# Patient Record
Sex: Female | Born: 1970 | Race: White | Hispanic: No | Smoking: Never smoker
Health system: Southern US, Community
[De-identification: ages and names within clinical notes are randomized; demographics above are authoritative.]

## PROBLEM LIST (undated history)

## (undated) DIAGNOSIS — F419 Anxiety disorder, unspecified: Secondary | ICD-10-CM

## (undated) DIAGNOSIS — E785 Hyperlipidemia, unspecified: Secondary | ICD-10-CM

## (undated) DIAGNOSIS — G4700 Insomnia, unspecified: Secondary | ICD-10-CM

## (undated) DIAGNOSIS — G2581 Restless legs syndrome: Secondary | ICD-10-CM

## (undated) DIAGNOSIS — E119 Type 2 diabetes mellitus without complications: Secondary | ICD-10-CM

## (undated) DIAGNOSIS — K219 Gastro-esophageal reflux disease without esophagitis: Secondary | ICD-10-CM

## (undated) DIAGNOSIS — I10 Essential (primary) hypertension: Secondary | ICD-10-CM

## (undated) DIAGNOSIS — F339 Major depressive disorder, recurrent, unspecified: Secondary | ICD-10-CM

## (undated) DIAGNOSIS — E669 Obesity, unspecified: Secondary | ICD-10-CM

## (undated) DIAGNOSIS — D509 Iron deficiency anemia, unspecified: Secondary | ICD-10-CM

## (undated) HISTORY — DX: Obesity, unspecified: E66.9

## (undated) HISTORY — PX: BREAST EXCISIONAL BIOPSY: SUR124

## (undated) HISTORY — DX: Essential (primary) hypertension: I10

## (undated) HISTORY — DX: Gastro-esophageal reflux disease without esophagitis: K21.9

## (undated) HISTORY — DX: Type 2 diabetes mellitus without complications: E11.9

## (undated) HISTORY — PX: TONSILLECTOMY: SUR1361

## (undated) HISTORY — DX: Insomnia, unspecified: G47.00

## (undated) HISTORY — PX: CERVICAL ABLATION: SHX5771

## (undated) HISTORY — DX: Iron deficiency anemia, unspecified: D50.9

## (undated) HISTORY — DX: Hyperlipidemia, unspecified: E78.5

## (undated) HISTORY — DX: Major depressive disorder, recurrent, unspecified: F33.9

## (undated) HISTORY — DX: Restless legs syndrome: G25.81

## (undated) HISTORY — DX: Anxiety disorder, unspecified: F41.9

---

## 2008-01-26 ENCOUNTER — Encounter: Admission: RE | Admit: 2008-01-26 | Discharge: 2008-01-26 | Payer: Self-pay | Admitting: Unknown Physician Specialty

## 2008-08-09 ENCOUNTER — Encounter: Admission: RE | Admit: 2008-08-09 | Discharge: 2008-08-09 | Payer: Self-pay | Admitting: Unknown Physician Specialty

## 2009-01-20 ENCOUNTER — Encounter: Admission: RE | Admit: 2009-01-20 | Discharge: 2009-01-20 | Payer: Self-pay | Admitting: Unknown Physician Specialty

## 2010-02-03 ENCOUNTER — Encounter: Admission: RE | Admit: 2010-02-03 | Discharge: 2010-02-03 | Payer: Self-pay | Admitting: Unknown Physician Specialty

## 2011-04-04 ENCOUNTER — Other Ambulatory Visit: Payer: Self-pay | Admitting: Unknown Physician Specialty

## 2011-04-04 DIAGNOSIS — Z1231 Encounter for screening mammogram for malignant neoplasm of breast: Secondary | ICD-10-CM

## 2011-04-09 ENCOUNTER — Ambulatory Visit: Payer: Self-pay

## 2011-04-19 ENCOUNTER — Ambulatory Visit
Admission: RE | Admit: 2011-04-19 | Discharge: 2011-04-19 | Disposition: A | Discharge status: Home / Self Care | Payer: BC Managed Care – PPO | Source: Ambulatory Visit | Attending: Unknown Physician Specialty | Admitting: Unknown Physician Specialty

## 2011-04-19 DIAGNOSIS — Z1231 Encounter for screening mammogram for malignant neoplasm of breast: Secondary | ICD-10-CM

## 2011-04-20 ENCOUNTER — Ambulatory Visit: Payer: Self-pay

## 2012-04-07 ENCOUNTER — Other Ambulatory Visit: Payer: Self-pay | Admitting: Unknown Physician Specialty

## 2012-04-07 DIAGNOSIS — Z1231 Encounter for screening mammogram for malignant neoplasm of breast: Secondary | ICD-10-CM

## 2012-05-09 ENCOUNTER — Ambulatory Visit
Admission: RE | Admit: 2012-05-09 | Discharge: 2012-05-09 | Disposition: A | Discharge status: Home / Self Care | Payer: BC Managed Care – PPO | Source: Ambulatory Visit | Attending: Unknown Physician Specialty | Admitting: Unknown Physician Specialty

## 2012-05-09 DIAGNOSIS — Z1231 Encounter for screening mammogram for malignant neoplasm of breast: Secondary | ICD-10-CM

## 2013-06-22 ENCOUNTER — Other Ambulatory Visit: Payer: Self-pay

## 2013-12-21 ENCOUNTER — Other Ambulatory Visit: Payer: Self-pay

## 2013-12-21 DIAGNOSIS — Z1231 Encounter for screening mammogram for malignant neoplasm of breast: Secondary | ICD-10-CM

## 2013-12-22 ENCOUNTER — Ambulatory Visit
Admission: RE | Admit: 2013-12-22 | Discharge: 2013-12-22 | Disposition: A | Discharge status: Home / Self Care | Payer: BC Managed Care – PPO | Source: Ambulatory Visit

## 2013-12-22 DIAGNOSIS — Z1231 Encounter for screening mammogram for malignant neoplasm of breast: Secondary | ICD-10-CM

## 2013-12-24 ENCOUNTER — Other Ambulatory Visit: Payer: Self-pay | Admitting: Unknown Physician Specialty

## 2013-12-24 DIAGNOSIS — R928 Other abnormal and inconclusive findings on diagnostic imaging of breast: Secondary | ICD-10-CM

## 2013-12-29 ENCOUNTER — Ambulatory Visit
Admission: RE | Admit: 2013-12-29 | Discharge: 2013-12-29 | Disposition: A | Discharge status: Home / Self Care | Payer: BC Managed Care – PPO | Source: Ambulatory Visit | Attending: Unknown Physician Specialty | Admitting: Unknown Physician Specialty

## 2013-12-29 DIAGNOSIS — R928 Other abnormal and inconclusive findings on diagnostic imaging of breast: Secondary | ICD-10-CM

## 2014-01-06 ENCOUNTER — Other Ambulatory Visit: Payer: BC Managed Care – PPO

## 2014-06-23 ENCOUNTER — Other Ambulatory Visit: Payer: Self-pay | Admitting: Unknown Physician Specialty

## 2014-06-23 DIAGNOSIS — N6320 Unspecified lump in the left breast, unspecified quadrant: Secondary | ICD-10-CM

## 2014-07-06 ENCOUNTER — Other Ambulatory Visit: Payer: BC Managed Care – PPO

## 2014-07-07 ENCOUNTER — Ambulatory Visit
Admission: RE | Admit: 2014-07-07 | Discharge: 2014-07-07 | Disposition: A | Discharge status: Home / Self Care | Payer: BC Managed Care – PPO | Source: Ambulatory Visit | Attending: Unknown Physician Specialty | Admitting: Unknown Physician Specialty

## 2014-07-07 DIAGNOSIS — N6320 Unspecified lump in the left breast, unspecified quadrant: Secondary | ICD-10-CM

## 2015-01-25 ENCOUNTER — Other Ambulatory Visit: Payer: Self-pay | Admitting: Unknown Physician Specialty

## 2015-01-25 DIAGNOSIS — N6320 Unspecified lump in the left breast, unspecified quadrant: Secondary | ICD-10-CM

## 2015-01-28 ENCOUNTER — Ambulatory Visit
Admission: RE | Admit: 2015-01-28 | Discharge: 2015-01-28 | Disposition: A | Discharge status: Home / Self Care | Payer: BLUE CROSS/BLUE SHIELD | Source: Ambulatory Visit | Attending: Unknown Physician Specialty | Admitting: Unknown Physician Specialty

## 2015-01-28 DIAGNOSIS — N6320 Unspecified lump in the left breast, unspecified quadrant: Secondary | ICD-10-CM

## 2016-04-05 ENCOUNTER — Other Ambulatory Visit: Payer: Self-pay | Admitting: Unknown Physician Specialty

## 2016-04-05 DIAGNOSIS — N6489 Other specified disorders of breast: Secondary | ICD-10-CM

## 2016-04-20 ENCOUNTER — Ambulatory Visit
Admission: RE | Admit: 2016-04-20 | Discharge: 2016-04-20 | Disposition: A | Discharge status: Home / Self Care | Payer: BLUE CROSS/BLUE SHIELD | Source: Ambulatory Visit | Attending: Unknown Physician Specialty | Admitting: Unknown Physician Specialty

## 2016-04-20 ENCOUNTER — Other Ambulatory Visit: Payer: Self-pay | Admitting: Unknown Physician Specialty

## 2016-04-20 DIAGNOSIS — N6489 Other specified disorders of breast: Secondary | ICD-10-CM

## 2016-04-25 ENCOUNTER — Other Ambulatory Visit: Payer: Self-pay | Admitting: Unknown Physician Specialty

## 2016-04-25 ENCOUNTER — Ambulatory Visit
Admission: RE | Admit: 2016-04-25 | Discharge: 2016-04-25 | Disposition: A | Discharge status: Home / Self Care | Payer: BLUE CROSS/BLUE SHIELD | Source: Ambulatory Visit | Attending: Unknown Physician Specialty | Admitting: Unknown Physician Specialty

## 2016-04-25 DIAGNOSIS — N6489 Other specified disorders of breast: Secondary | ICD-10-CM

## 2017-05-02 ENCOUNTER — Other Ambulatory Visit: Payer: Self-pay | Admitting: Obstetrics and Gynecology

## 2017-05-02 DIAGNOSIS — Z1231 Encounter for screening mammogram for malignant neoplasm of breast: Secondary | ICD-10-CM

## 2017-05-20 ENCOUNTER — Ambulatory Visit: Payer: BLUE CROSS/BLUE SHIELD

## 2017-06-19 ENCOUNTER — Ambulatory Visit
Admission: RE | Admit: 2017-06-19 | Discharge: 2017-06-19 | Disposition: A | Discharge status: Home / Self Care | Payer: BLUE CROSS/BLUE SHIELD | Source: Ambulatory Visit | Attending: Obstetrics and Gynecology | Admitting: Obstetrics and Gynecology

## 2017-06-19 ENCOUNTER — Ambulatory Visit: Payer: BLUE CROSS/BLUE SHIELD

## 2017-06-19 DIAGNOSIS — Z1231 Encounter for screening mammogram for malignant neoplasm of breast: Secondary | ICD-10-CM

## 2017-11-22 ENCOUNTER — Encounter: Payer: Self-pay | Admitting: Internal Medicine

## 2017-11-26 ENCOUNTER — Encounter: Payer: Self-pay | Admitting: *Deleted

## 2017-12-03 ENCOUNTER — Encounter: Payer: Self-pay | Admitting: Internal Medicine

## 2017-12-03 ENCOUNTER — Ambulatory Visit: Payer: BLUE CROSS/BLUE SHIELD | Admitting: Internal Medicine

## 2017-12-03 VITALS — BP 128/76 | HR 80 | Ht 63.0 in | Wt 203.0 lb

## 2017-12-03 DIAGNOSIS — K219 Gastro-esophageal reflux disease without esophagitis: Secondary | ICD-10-CM

## 2017-12-03 DIAGNOSIS — D509 Iron deficiency anemia, unspecified: Secondary | ICD-10-CM | POA: Diagnosis not present

## 2017-12-03 DIAGNOSIS — R1310 Dysphagia, unspecified: Secondary | ICD-10-CM | POA: Diagnosis not present

## 2017-12-03 DIAGNOSIS — R1319 Other dysphagia: Secondary | ICD-10-CM

## 2017-12-03 MED ORDER — SUPREP BOWEL PREP KIT 17.5-3.13-1.6 GM/177ML PO SOLN: 1.0000 | ORAL | 0 refills | Status: DC

## 2017-12-03 NOTE — Patient Instructions (Addendum)
You have been scheduled for an endoscopy and colonoscopy. Please follow the written instructions given to you at your visit today. Please pick up your prep supplies at the pharmacy within the next 1-3 days. If you use inhalers (even only as needed), please bring them with you on the day of your procedure. Your physician has requested that you go to www.startemmi.com and enter the access code given to you at your visit today. This web site gives a general overview about your procedure. However, you should still follow specific instructions given to you by our office regarding your preparation for the procedure.  Please purchase the following medications over the counter and take as directed: Ferrous Sulfate-325 mg once daily--- Hold 1 week prior to procedure  If you are age 73 or older, your body mass index should be between 23-30. Your Body mass index is 35.96 kg/m. If this is out of the aforementioned range listed, please consider follow up with your Primary Care Provider.  If you are age 36 or younger, your body mass index should be between 19-25. Your Body mass index is 35.96 kg/m. If this is out of the aformentioned range listed, please consider follow up with your Primary Care Provider.

## 2017-12-04 NOTE — Progress Notes (Signed)
Patient ID: Marilyn Mccall, female   DOB: [birth year], 47 y.o.   MRN: [id number] HPI: Marilyn Mccall is a 47 year old female with a history of GERD, menorrhagia, depression, hypertension, hyperlipidemia who was seen in consultation at the request of Dr. Maurilio Lovely Pomposini to evaluate iron deficiency anemia.  She is here today with her mother.  She reports she recently established care with her new primary physician and was told she needed a GI consultation for IDA.    She denies any visible blood in her stool and melena.  She does not have any specific GI complaint.  She does have a history of heartburn and reflux and has been treated with omeprazole since the early 1990s.  Heartburn is well controlled on omeprazole 40 mg daily.  Occasionally when eating foods such as meat food will stop in her esophagus and she will feel a choking sensation.  She is able to usually force food down with liquids.  This is happening very intermittently and is not been progressive.  She does have at times loose stools which are stress triggered.  She has a family history of colon cancer with her maternal great grandfather, a maternal great aunt and a maternal uncle with colon polyps.  Greater than 10 years ago she had a upper endoscopy performed in [place] and recalls a history of gastritis.  She has been dealing with very heavy menstrual periods which have been irregular over the last 6 months.  Her periods have always been rather heavy but in the last 6 months they have become more so.  She is following with GYN for this issue.  Past Medical History:  Diagnosis Date  . Anxiety   . Benign essential hypertension   . Depression, major, recurrent (HCC)   . GERD (gastroesophageal reflux disease)   . Hyperlipidemia   . IDA (iron deficiency anemia)   . Insomnia   . Obesity   . Restless legs     Past Surgical History:  Procedure Laterality Date  . BREAST EXCISIONAL BIOPSY    . CESAREAN SECTION    . TONSILLECTOMY       Outpatient Medications Prior to Visit  Medication Sig Dispense Refill  . ferrous sulfate 325 (65 FE) MG tablet Take 1 tablet by mouth 2 (two) times daily.    Marland Kitchen FLUoxetine (PROZAC) 40 MG capsule Take 1 capsule by mouth daily.    Marland Kitchen lisinopril-hydrochlorothiazide (PRINZIDE,ZESTORETIC) 20-12.5 MG tablet Take by mouth daily.    Marland Kitchen omeprazole (PRILOSEC) 40 MG capsule Take 1 capsule by mouth daily.    . pramipexole (MIRAPEX) 0.25 MG tablet Take 1 tablet by mouth at bedtime as needed.    . traZODone (DESYREL) 50 MG tablet Take 1 tablet by mouth at bedtime as needed.     No facility-administered medications prior to visit.     Allergies  Allergen Reactions  . Cephalexin Other (See Comments)    Other Reaction: GI Upset  . Erythromycin Other (See Comments)    Other Reaction: GI Upset  . Simvastatin Other (See Comments)    myalgias    Family History  Problem Relation Age of Onset  . Hypertension Father   . Glaucoma Father   . Retinopathy of prematurity Father   . Arthritis Brother        psoriatic    Social History   Tobacco Use  . Smoking status: Never Smoker  . Smokeless tobacco: Never Used  Substance Use Topics  . Alcohol use: Yes    Comment: socially  .  Drug use: Never    ROS: As per history of present illness, otherwise negative  BP 128/76   Pulse 80   Ht  (1.6 m)   Wt 203 lb (92.1 kg)   LMP 11/22/2017 (Approximate)   BMI 35.96 kg/m  Constitutional: Well-developed and well-nourished. No distress. HEENT: Normocephalic and atraumatic. Oropharynx is clear and moist. Conjunctivae are normal.  No scleral icterus. Neck: Neck supple. Trachea midline. Cardiovascular: Normal rate, regular rhythm and intact distal pulses. No M/R/G Pulmonary/chest: Effort normal and breath sounds normal. No wheezing, rales or rhonchi. Abdominal: Soft, nontender, nondistended. Bowel sounds active throughout. There are no masses palpable. No hepatosplenomegaly. Extremities: no  clubbing, cyanosis, or edema Neurological: Alert and oriented to person place and time. Skin: Skin is warm and dry.  Psychiatric: Normal mood and affect. Behavior is normal.  RELEVANT LABS AND IMAGING: Labs from primary care from May 2019 Ferritin 5 Total iron 37 B12 364 Hemoglobin 10.1, MCV 73.2 Platelet count normal 227, white count 8.1 TSH 0.94 Vitamin D 34 Normal liver enzymes, AST 16, ALT 13, total bilirubin 0.4, alkaline phosphatase 71   ASSESSMENT/PLAN: 47 year old female with a history of GERD, menorrhagia, depression, hypertension, hyperlipidemia who was seen in consultation at the request of Dr. Maurilio Lovely Pomposini to evaluate iron deficiency anemia.  1. IDA --very likely her iron deficiency is due to heavy menstrual bleeding in the last 6 months.  We discussed how at times iron deficiency anemia can come from GI blood loss.  She has had no visible blood loss.  After our discussion she would prefer reassurance from knowing there is no GI pathology, which I think is very reasonable.  We will proceed with upper endoscopy and colonoscopy to further evaluate her iron deficiency anemia.  We discussed the risk, benefits and alternatives and she is agreeable to proceed.  Plan small bowel biopsies to exclude celiac disease. --She has not yet started oral iron but I recommend she start ferrous sulfate 325 mg at least once daily.  If hard for her to tolerate we can consider IV iron.  She should have hemoglobin and iron studies followed closely to ensure improvement.  2.  GERD with intermittent mild dysphagia --long-standing reflux disease well-controlled on omeprazole 40 mg daily.  Upper endoscopy being performed as per #1 but will consider dilation if esophageal stricture or ring is found.  Cc: Dr. Reuel Boom Pomposini

## 2017-12-18 ENCOUNTER — Telehealth: Payer: Self-pay | Admitting: Internal Medicine

## 2017-12-18 NOTE — Telephone Encounter (Signed)
A user error has taken place: ERROR °

## 2017-12-19 ENCOUNTER — Encounter: Payer: Self-pay | Admitting: Internal Medicine

## 2017-12-26 ENCOUNTER — Encounter: Payer: BLUE CROSS/BLUE SHIELD | Admitting: Internal Medicine

## 2018-05-15 ENCOUNTER — Other Ambulatory Visit: Payer: Self-pay | Admitting: Obstetrics and Gynecology

## 2018-05-15 DIAGNOSIS — Z1231 Encounter for screening mammogram for malignant neoplasm of breast: Secondary | ICD-10-CM

## 2018-06-25 ENCOUNTER — Ambulatory Visit: Payer: BLUE CROSS/BLUE SHIELD

## 2018-06-27 ENCOUNTER — Ambulatory Visit
Admission: RE | Admit: 2018-06-27 | Discharge: 2018-06-27 | Disposition: A | Discharge status: Home / Self Care | Payer: BLUE CROSS/BLUE SHIELD | Source: Ambulatory Visit | Attending: Obstetrics and Gynecology | Admitting: Obstetrics and Gynecology

## 2018-06-27 DIAGNOSIS — Z1231 Encounter for screening mammogram for malignant neoplasm of breast: Secondary | ICD-10-CM

## 2019-06-05 ENCOUNTER — Other Ambulatory Visit: Payer: Self-pay | Admitting: Obstetrics and Gynecology

## 2019-06-05 DIAGNOSIS — Z1231 Encounter for screening mammogram for malignant neoplasm of breast: Secondary | ICD-10-CM

## 2019-06-22 ENCOUNTER — Other Ambulatory Visit: Payer: Self-pay | Admitting: Obstetrics and Gynecology

## 2019-06-22 DIAGNOSIS — N946 Dysmenorrhea, unspecified: Secondary | ICD-10-CM

## 2019-06-30 ENCOUNTER — Ambulatory Visit
Admission: RE | Admit: 2019-06-30 | Discharge: 2019-06-30 | Disposition: A | Discharge status: Home / Self Care | Payer: BLUE CROSS/BLUE SHIELD | Source: Ambulatory Visit | Attending: Obstetrics and Gynecology | Admitting: Obstetrics and Gynecology

## 2019-06-30 DIAGNOSIS — N946 Dysmenorrhea, unspecified: Secondary | ICD-10-CM

## 2019-07-31 ENCOUNTER — Ambulatory Visit
Admission: RE | Admit: 2019-07-31 | Discharge: 2019-07-31 | Disposition: A | Discharge status: Home / Self Care | Payer: BC Managed Care – PPO | Source: Ambulatory Visit | Attending: Obstetrics and Gynecology | Admitting: Obstetrics and Gynecology

## 2019-07-31 ENCOUNTER — Other Ambulatory Visit: Payer: Self-pay

## 2019-07-31 DIAGNOSIS — Z1231 Encounter for screening mammogram for malignant neoplasm of breast: Secondary | ICD-10-CM

## 2020-08-15 ENCOUNTER — Other Ambulatory Visit: Payer: Self-pay | Admitting: Obstetrics and Gynecology

## 2020-08-15 DIAGNOSIS — Z1231 Encounter for screening mammogram for malignant neoplasm of breast: Secondary | ICD-10-CM

## 2020-09-30 ENCOUNTER — Ambulatory Visit
Admission: RE | Admit: 2020-09-30 | Discharge: 2020-09-30 | Disposition: A | Discharge status: Home / Self Care | Payer: BC Managed Care – PPO | Source: Ambulatory Visit | Attending: Obstetrics and Gynecology | Admitting: Obstetrics and Gynecology

## 2020-09-30 ENCOUNTER — Other Ambulatory Visit: Payer: Self-pay

## 2020-09-30 DIAGNOSIS — Z1231 Encounter for screening mammogram for malignant neoplasm of breast: Secondary | ICD-10-CM

## 2021-10-16 ENCOUNTER — Other Ambulatory Visit: Payer: Self-pay | Admitting: Obstetrics and Gynecology

## 2021-10-16 DIAGNOSIS — Z1231 Encounter for screening mammogram for malignant neoplasm of breast: Secondary | ICD-10-CM

## 2021-10-24 ENCOUNTER — Ambulatory Visit
Admission: RE | Admit: 2021-10-24 | Discharge: 2021-10-24 | Disposition: A | Discharge status: Home / Self Care | Payer: BC Managed Care – PPO | Source: Ambulatory Visit | Attending: Obstetrics and Gynecology | Admitting: Obstetrics and Gynecology

## 2021-10-24 DIAGNOSIS — Z1231 Encounter for screening mammogram for malignant neoplasm of breast: Secondary | ICD-10-CM

## 2022-08-31 ENCOUNTER — Encounter: Payer: Self-pay | Admitting: Internal Medicine

## 2022-10-11 ENCOUNTER — Encounter: Payer: Self-pay | Admitting: Internal Medicine

## 2022-10-11 ENCOUNTER — Other Ambulatory Visit: Payer: Self-pay | Admitting: Obstetrics and Gynecology

## 2022-10-11 DIAGNOSIS — Z1231 Encounter for screening mammogram for malignant neoplasm of breast: Secondary | ICD-10-CM

## 2022-11-07 IMAGING — MG MM DIGITAL SCREENING BILAT W/ TOMO AND CAD
6 of 10 series · 6 of 30 positions shown · non-contrast
Comparison: Previous exam(s).

CLINICAL DATA: Screening.

EXAM:
DIGITAL SCREENING BILATERAL MAMMOGRAM WITH TOMOSYNTHESIS AND CAD
TECHNIQUE: Bilateral screening digital craniocaudal and mediolateral oblique
mammograms were obtained. Bilateral screening digital breast
tomosynthesis was performed. The images were evaluated with
computer-aided detection.

[R CC synth-2D]
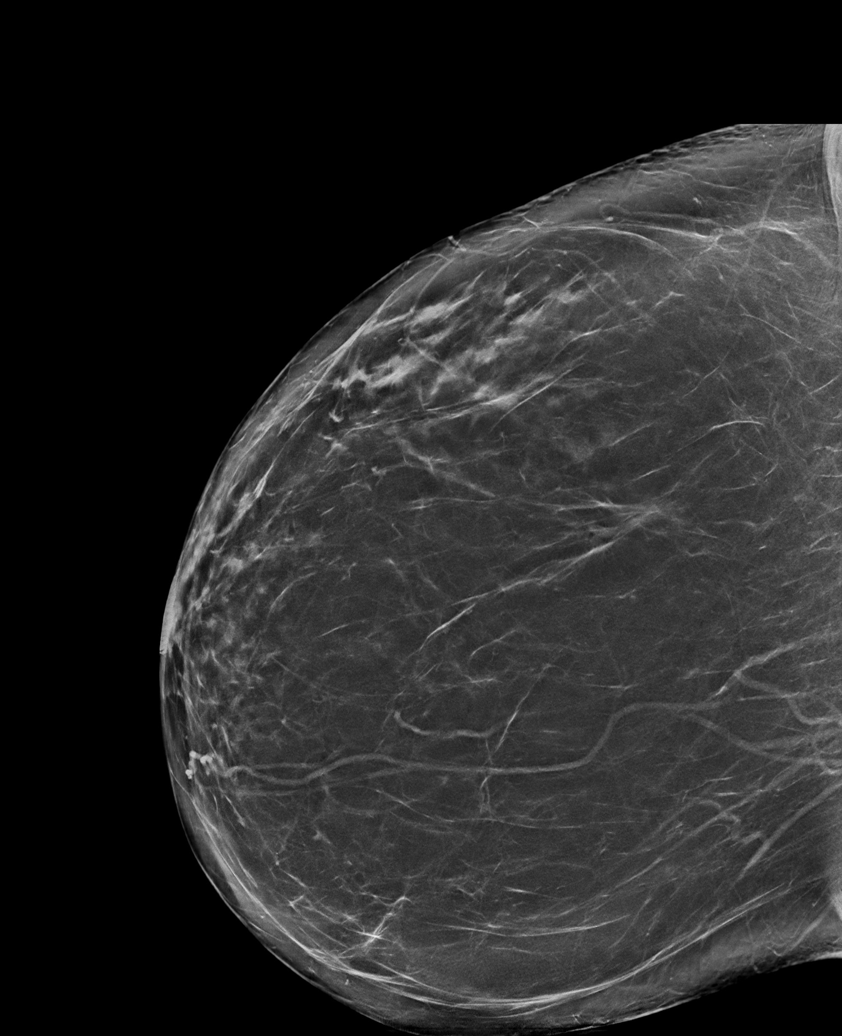

[R CV synth-2D]
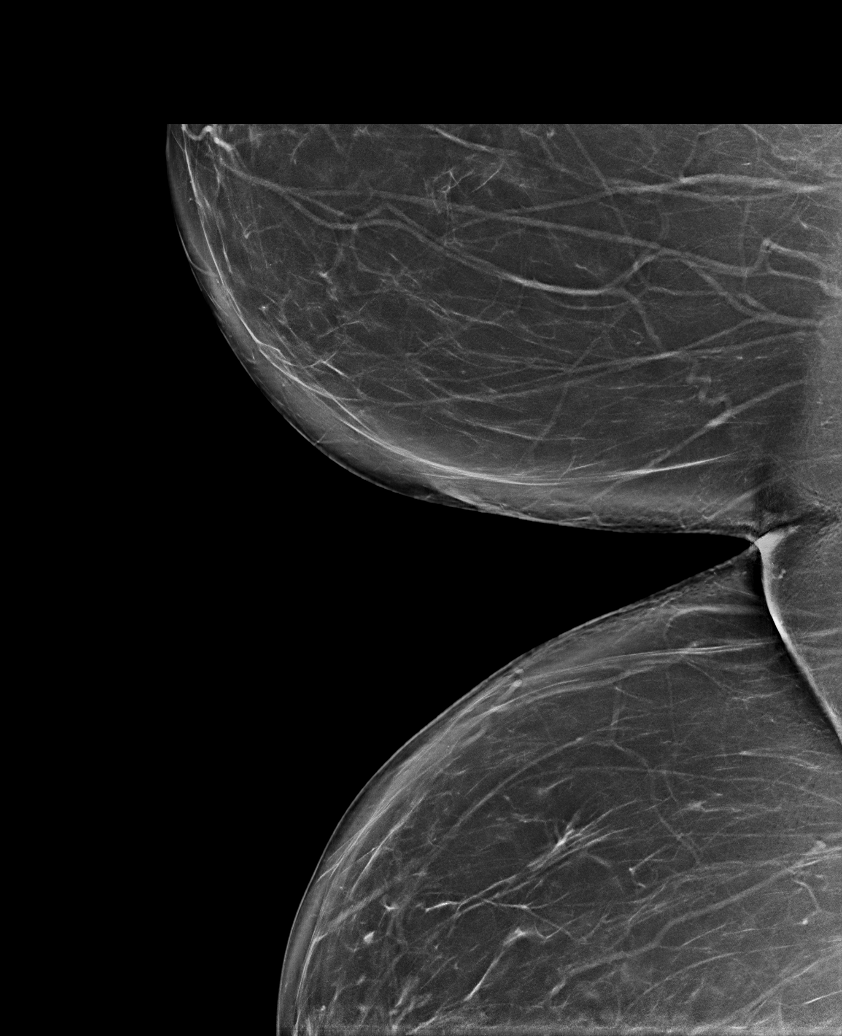

[L CC synth-2D]
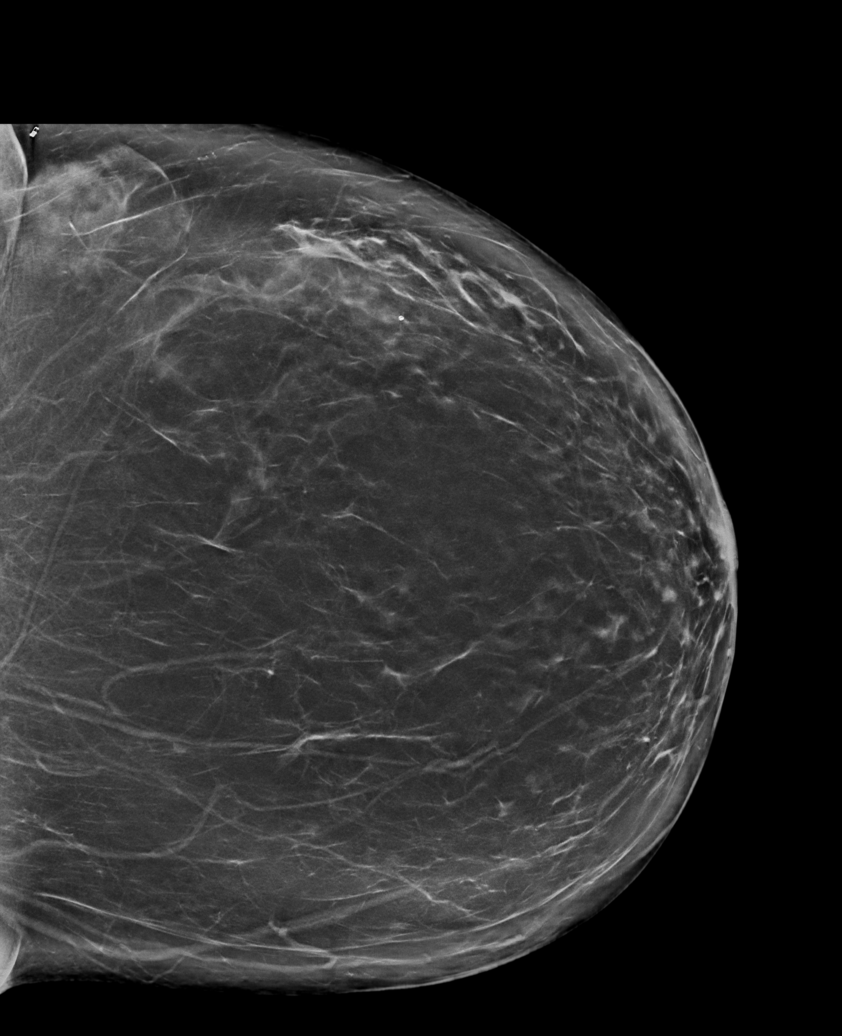

[R MLO synth-2D]
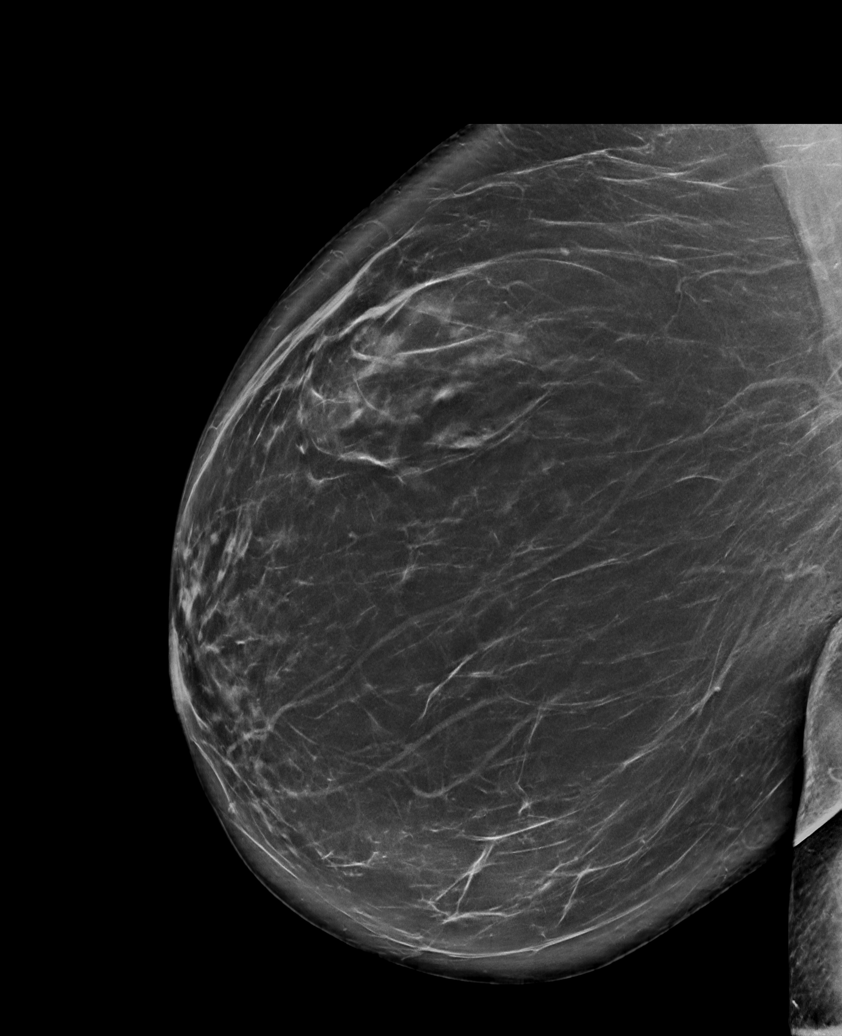

[L MLO synth-2D]
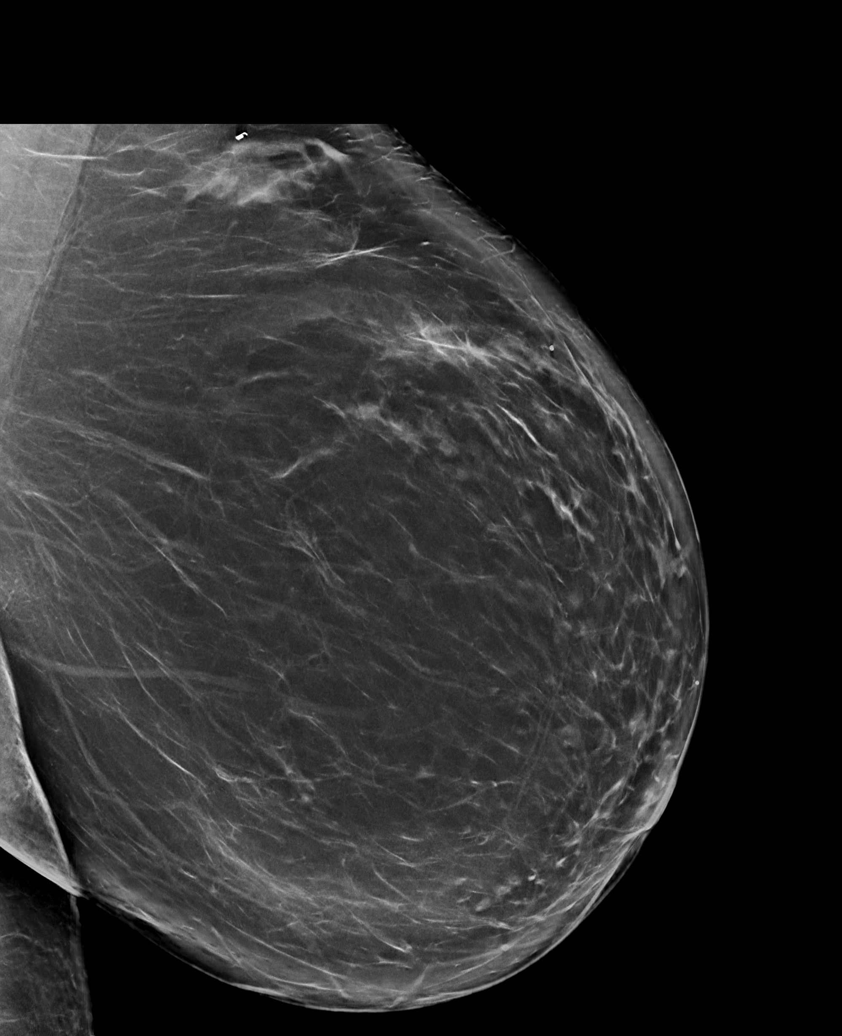

[R CV tomo · tomo slice 47/94.0]
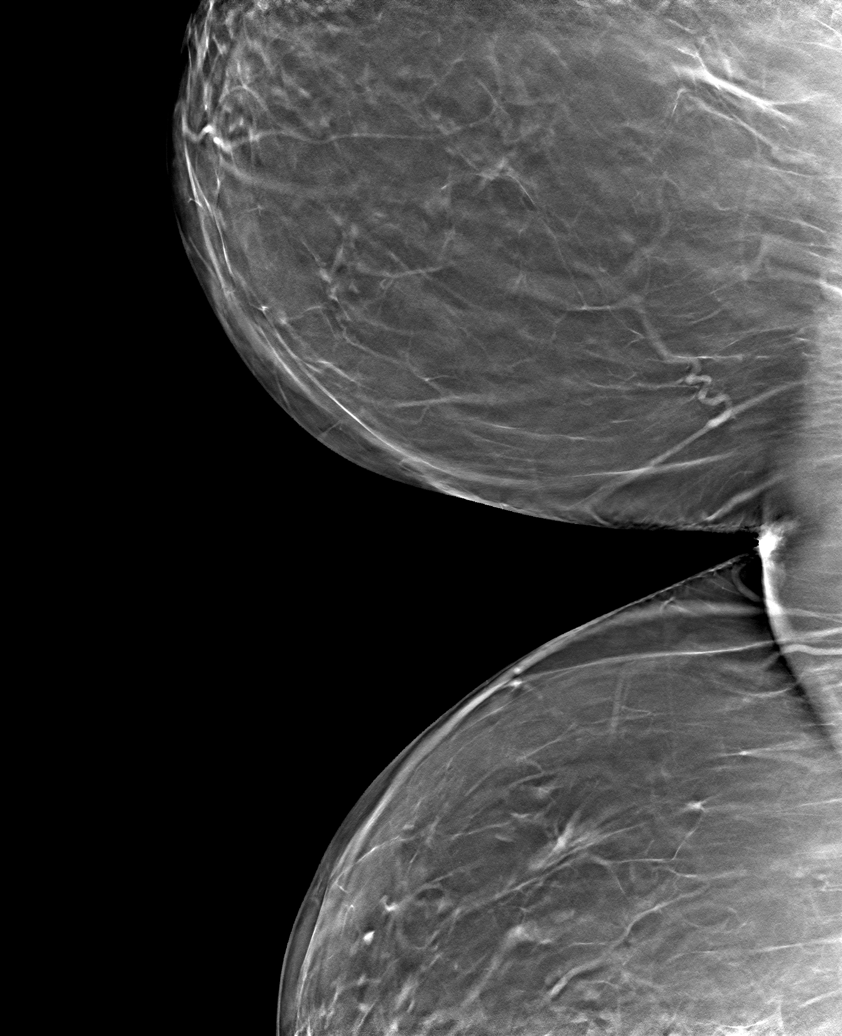

[6 of 30 positions shown; findings below may reference images not displayed]

ACR Breast Density Category b: There are scattered areas of
fibroglandular density.
FINDINGS: There are no findings suspicious for malignancy.
IMPRESSION: No mammographic evidence of malignancy. A result letter of this
screening mammogram will be mailed directly to the patient.

RECOMMENDATION:
Screening mammogram in one year. (Code:51-O-LD2)

BI-RADS CATEGORY  1: Negative.

## 2022-11-23 ENCOUNTER — Ambulatory Visit: Payer: BC Managed Care – PPO

## 2022-12-07 ENCOUNTER — Ambulatory Visit
Admission: RE | Admit: 2022-12-07 | Discharge: 2022-12-07 | Disposition: A | Discharge status: Home / Self Care | Payer: BC Managed Care – PPO | Source: Ambulatory Visit | Attending: Obstetrics and Gynecology | Admitting: Obstetrics and Gynecology

## 2022-12-07 DIAGNOSIS — Z1231 Encounter for screening mammogram for malignant neoplasm of breast: Secondary | ICD-10-CM

## 2023-01-22 ENCOUNTER — Telehealth: Payer: Self-pay

## 2023-01-22 NOTE — Telephone Encounter (Signed)
Unable to complete pre vist.  Patient rescheduled for 7/19

## 2023-01-30 ENCOUNTER — Ambulatory Visit: Payer: BC Managed Care – PPO | Admitting: Internal Medicine

## 2023-02-01 ENCOUNTER — Ambulatory Visit (AMBULATORY_SURGERY_CENTER): Payer: BC Managed Care – PPO | Admitting: *Deleted

## 2023-02-01 VITALS — Ht 63.0 in | Wt 195.0 lb

## 2023-02-01 DIAGNOSIS — Z1211 Encounter for screening for malignant neoplasm of colon: Secondary | ICD-10-CM

## 2023-02-01 MED ORDER — NA SULFATE-K SULFATE-MG SULF 17.5-3.13-1.6 GM/177ML PO SOLN: 1.0000 | Freq: Once | ORAL | 0 refills | Status: AC

## 2023-02-01 NOTE — Progress Notes (Signed)
Pt's name and DOB verified at the beginning of the pre-visit.  Pt denies any difficulty with ambulating,sitting, laying down or rolling side to side Gave both [facility] main # and MD on call # prior to instructions.  No egg or soy allergy known to patient  No issues known to pt with past sedation with any surgeries or procedures Pt denies having issues being intubated Pt has no issues moving head neck or swallowing No FH of Malignant Hyperthermia Pt is not on diet pills Pt is not on home 02  Pt is not on blood thinners  Pt denies issues with constipation  Pt is not on dialysis Pt denise any abnormal heart rhythms  Pt denies any upcoming cardiac testing Pt encouraged to use to use Singlecare or Goodrx to reduce cost  Patient's chart reviewed by John Nulty CNRA prior to pre-visit and patient appropriate for the [facility].  Pre-visit completed and red dot placed by patient's name on their procedure day (on provider's schedule).  . Visit by phone Pt states weight is 195 lb Instructed pt why it is important to and  to call if they have any changes in health or new medications. Directed them to the # given and on instructions.   Pt states they will.  Instructions reviewed with pt and pt states understanding. Instructed to review again prior to procedure. Pt states they will.  Instructions sent by mail with coupon and by my chart   

## 2023-02-07 ENCOUNTER — Ambulatory Visit: Payer: BC Managed Care – PPO | Admitting: Internal Medicine

## 2023-02-12 ENCOUNTER — Encounter: Payer: Self-pay | Admitting: Internal Medicine

## 2023-02-15 ENCOUNTER — Encounter: Payer: BC Managed Care – PPO | Admitting: Internal Medicine

## 2023-02-15 NOTE — Telephone Encounter (Signed)
Inbound call from patient stating her medication list has changed. She is now taking atorva statin 40 mg instead of 20 mg. Please advise.   Thank you

## 2023-02-15 NOTE — Addendum Note (Signed)
Addended by: Darylene Price on: 02/15/2023 04:39 PM   Modules accepted: Orders

## 2023-02-15 NOTE — Telephone Encounter (Signed)
Update made on patients med list pt made aware

## 2023-02-22 ENCOUNTER — Encounter: Payer: Self-pay | Admitting: Internal Medicine

## 2023-02-22 ENCOUNTER — Ambulatory Visit (AMBULATORY_SURGERY_CENTER): Payer: BC Managed Care – PPO | Admitting: Internal Medicine

## 2023-02-22 VITALS — BP 127/49 | HR 54 | Temp 98.2°F | Resp 15 | Ht 63.0 in | Wt 195.0 lb

## 2023-02-22 DIAGNOSIS — Z1211 Encounter for screening for malignant neoplasm of colon: Secondary | ICD-10-CM | POA: Diagnosis present

## 2023-02-22 DIAGNOSIS — D122 Benign neoplasm of ascending colon: Secondary | ICD-10-CM | POA: Diagnosis not present

## 2023-02-22 MED ORDER — SODIUM CHLORIDE 0.9 % IV SOLN: 500.0000 mL | Freq: Once | INTRAVENOUS | Status: DC

## 2023-02-22 NOTE — Progress Notes (Signed)
Called to room to assist during endoscopic procedure.  Patient ID and intended procedure confirmed with present staff. Received instructions for my participation in the procedure from the performing physician.  

## 2023-02-22 NOTE — Progress Notes (Signed)
Report to PACU, RN, vss, BBS= Clear.  

## 2023-02-22 NOTE — Progress Notes (Signed)
Pt's states no medical or surgical changes since previsit or office visit. 

## 2023-02-22 NOTE — Op Note (Signed)
[facility] Patient Name: Marilyn Mccall Procedure Date: 02/22/2023 9:16 AM MRN: [id number] Endoscopist: Beverley Fiedler , MD, [id number] Age: 52 Referring MD:  Date of Birth: 1970/10/08 Gender: Female Account #: [id number] Procedure:                Colonoscopy Indications:              Screening for colorectal malignant neoplasm, This                            is the patient's first colonoscopy Medicines:                Monitored Anesthesia Care Procedure:                Pre-Anesthesia Assessment:                           - Prior to the procedure, a History and Physical                            was performed, and patient medications and                            allergies were reviewed. The patient's tolerance of                            previous anesthesia was also reviewed. The risks                            and benefits of the procedure and the sedation                            options and risks were discussed with the patient.                            All questions were answered, and informed consent                            was obtained. Prior Anticoagulants: The patient has                            taken no anticoagulant or antiplatelet agents. ASA                            Grade Assessment: II - A patient with mild systemic                            disease. After reviewing the risks and benefits,                            the patient was deemed in satisfactory condition to                            undergo the procedure.  After obtaining informed consent, the colonoscope                            was passed under direct vision. Throughout the                            procedure, the patient's blood pressure, pulse, and                            oxygen saturations were monitored continuously. The                            Olympus PCF-[id number] (#[id number]) Colonoscope was                            introduced through the anus and  advanced to the                            colocolonic anastomosis. The colonoscopy was                            performed without difficulty. The patient tolerated                            the procedure well. The quality of the bowel                            preparation was good. The ileocecal valve,                            appendiceal orifice, and rectum were photographed. Scope In: 9:32:40 AM Scope Out: 9:48:43 AM Scope Withdrawal Time: 0 hours 12 minutes 23 seconds  Total Procedure Duration: 0 hours 16 minutes 3 seconds  Findings:                 The digital rectal exam was normal.                           A 3 mm polyp was found in the ascending colon. The                            polyp was sessile. The polyp was removed with a                            cold snare. Resection and retrieval were complete.                           A few small-mouthed diverticula were found in the                            sigmoid colon.                           The exam was otherwise without abnormality on  direct and retroflexion views. Complications:            No immediate complications. Estimated Blood Loss:     Estimated blood loss was minimal. Impression:               - One 3 mm polyp in the ascending colon, removed                            with a cold snare. Resected and retrieved.                           - Mild diverticulosis in the sigmoid colon.                           - The examination was otherwise normal on direct                            and retroflexion views. Recommendation:           - Patient has a contact number available for                            emergencies. The signs and symptoms of potential                            delayed complications were discussed with the                            patient. Return to normal activities tomorrow.                            Written discharge instructions were provided to the                             patient.                           - Resume previous diet.                           - Continue present medications.                           - Await pathology results.                           - Repeat colonoscopy is recommended. The                            colonoscopy date will be determined after pathology                            results from today's exam become available for                            review. Beverley Fiedler, MD 02/22/2023 9:55:06 AM This report has been signed electronically.

## 2023-02-22 NOTE — Patient Instructions (Signed)
Read all of the handouts given to you by your recovery room nurse.  Resume all of your previous medications as ordered.  YOU HAD AN ENDOSCOPIC PROCEDURE TODAY AT THE [facility]:   Refer to the procedure report that was given to you for any specific questions about what was found during the examination.  If the procedure report does not answer your questions, please call your gastroenterologist to clarify.  If you requested that your care partner not be given the details of your procedure findings, then the procedure report has been included in a sealed envelope for you to review at your convenience later.  YOU SHOULD EXPECT: Some feelings of bloating in the abdomen. Passage of more gas than usual.  Walking can help get rid of the air that was put into your GI tract during the procedure and reduce the bloating. If you had a lower endoscopy (such as a colonoscopy or flexible sigmoidoscopy) you may notice spotting of blood in your stool or on the toilet paper. If you underwent a bowel prep for your procedure, you may not have a normal bowel movement for a few days.  Please Note:  You might notice some irritation and congestion in your nose or some drainage.  This is from the oxygen used during your procedure.  There is no need for concern and it should clear up in a day or so.  SYMPTOMS TO REPORT IMMEDIATELY:  Following lower endoscopy (colonoscopy or flexible sigmoidoscopy):  Excessive amounts of blood in the stool  Significant tenderness or worsening of abdominal pains  Swelling of the abdomen that is new, acute  Fever of 100F or higher   For urgent or emergent issues, a gastroenterologist can be reached at any hour by calling [phone]. Do not use MyChart messaging for urgent concerns.    DIET:  We do recommend a small meal at first, but then you may proceed to your regular diet.  Drink plenty of fluids but you should avoid alcoholic beverages for 24 hours. Try to increase  the fiber in your diet, and drink plenty of water.  ACTIVITY:  You should plan to take it easy for the rest of today and you should NOT DRIVE or use heavy machinery until tomorrow (because of the sedation medicines used during the test).    FOLLOW UP: Our staff will call the number listed on your records the next business day following your procedure.  We will call around 7:15- 8:00 am to check on you and address any questions or concerns that you may have regarding the information given to you following your procedure. If we do not reach you, we will leave a message.     If any biopsies were taken you will be contacted by phone or by letter within the next 1-3 weeks.  Please call us at [phone] if you have not heard about the biopsies in 3 weeks.    SIGNATURES/CONFIDENTIALITY: You and/or your care partner have signed paperwork which will be entered into your electronic medical record.  These signatures attest to the fact that that the information above on your After Visit Summary has been reviewed and is understood.  Full responsibility of the confidentiality of this discharge information lies with you and/or your care-partner.

## 2023-02-22 NOTE — Progress Notes (Signed)
GASTROENTEROLOGY PROCEDURE H&P NOTE   Primary Care Physician: Pomposini, Rande Brunt, MD    Reason for Procedure:  Colon cancer screening  Plan:    Colonoscopy  Patient is appropriate for endoscopic procedure(s) in the ambulatory ([facility]) setting.  The nature of the procedure, as well as the risks, benefits, and alternatives were carefully and thoroughly reviewed with the patient. Ample time for discussion and questions allowed. The patient understood, was satisfied, and agreed to proceed.     HPI: Marilyn Mccall is a 52 y.o. female who presents for screening colonoscopy.  Medical history as below.  Tolerated the prep.  No recent chest pain or shortness of breath.  No abdominal pain today.  Past Medical History:  Diagnosis Date   Anxiety    Benign essential hypertension    Depression, major, recurrent (HCC)    Diabetes mellitus without complication (HCC)    GERD (gastroesophageal reflux disease)    Hyperlipidemia    IDA (iron deficiency anemia)    Insomnia    Obesity    Restless legs     Past Surgical History:  Procedure Laterality Date   BREAST EXCISIONAL BIOPSY Left 2002   CERVICAL ABLATION     2023   CESAREAN SECTION     TONSILLECTOMY      Prior to Admission medications   Medication Sig Start Date End Date Taking? Authorizing Provider  atorvastatin (LIPITOR) 40 MG tablet Take 40 mg by mouth daily. 02/12/23  Yes [provider]  calcium citrate (CALCITRATE - DOSED IN MG ELEMENTAL CALCIUM) 950 (200 Ca) MG tablet Take 200 mg of elemental calcium by mouth daily.   Yes [provider]  FLUoxetine (PROZAC) 40 MG capsule Take by mouth. 05/30/17  Yes [provider]  lisinopril-hydrochlorothiazide (PRINZIDE,ZESTORETIC) 20-12.5 MG tablet Take by mouth daily.   Yes [provider]  magnesium gluconate (MAGONATE) 500 MG tablet Take 400 mg by mouth 2 (two) times daily.   Yes [provider]  metFORMIN (GLUCOPHAGE) 500 MG tablet Take 1  tablet twice a day by oral route. 08/30/21  Yes [provider]  omeprazole (PRILOSEC) 40 MG capsule Take 1 capsule by mouth daily.   Yes [provider]  cyclobenzaprine (FLEXERIL) 10 MG tablet TAKE 1 TABLET BY MOUTH 1 HOUR BEFORE BEDTIME FOR TMJ    [provider]  fluconazole (DIFLUCAN) 100 MG tablet TAKE ONE TABLET DAILY FOR 3 DAYS THEN WEEKLY FOR 8 WEEKS prn Patient not taking: Reported on 02/01/2023    [provider]  pramipexole (MIRAPEX) 0.25 MG tablet Take 1 tablet by mouth at bedtime as needed. Patient not taking: Reported on 02/01/2023    [provider]  traZODone (DESYREL) 50 MG tablet Take 1 tablet by mouth at bedtime as needed.    [provider]    Current Outpatient Medications  Medication Sig Dispense Refill   atorvastatin (LIPITOR) 40 MG tablet Take 40 mg by mouth daily.     calcium citrate (CALCITRATE - DOSED IN MG ELEMENTAL CALCIUM) 950 (200 Ca) MG tablet Take 200 mg of elemental calcium by mouth daily.     FLUoxetine (PROZAC) 40 MG capsule Take by mouth.     lisinopril-hydrochlorothiazide (PRINZIDE,ZESTORETIC) 20-12.5 MG tablet Take by mouth daily.     magnesium gluconate (MAGONATE) 500 MG tablet Take 400 mg by mouth 2 (two) times daily.     metFORMIN (GLUCOPHAGE) 500 MG tablet Take 1 tablet twice a day by oral route.     omeprazole (  PRILOSEC) 40 MG capsule Take 1 capsule by mouth daily.     cyclobenzaprine (FLEXERIL) 10 MG tablet TAKE 1 TABLET BY MOUTH 1 HOUR BEFORE BEDTIME FOR TMJ     fluconazole (DIFLUCAN) 100 MG tablet TAKE ONE TABLET DAILY FOR 3 DAYS THEN WEEKLY FOR 8 WEEKS prn (Patient not taking: Reported on 02/01/2023)     pramipexole (MIRAPEX) 0.25 MG tablet Take 1 tablet by mouth at bedtime as needed. (Patient not taking: Reported on 02/01/2023)     traZODone (DESYREL) 50 MG tablet Take 1 tablet by mouth at bedtime as needed.     Current Facility-Administered Medications  Medication Dose Route Frequency  Provider Last Rate Last Admin   0.9 %  sodium chloride infusion  500 mL Intravenous Once Treshawn Allen, Carie Caddy, MD        Allergies as of 02/22/2023 - Review Complete 02/22/2023  Allergen Reaction Noted   Cephalexin Other (See Comments) 11/26/2017   Erythromycin Other (See Comments) and Nausea Only 11/26/2017   Simvastatin Other (See Comments) 11/26/2017   Latex Rash 02/01/2023    Family History  Problem Relation Age of Onset   Colon polyps Mother    Hypertension Father    Glaucoma Father    Retinopathy of prematurity Father    Arthritis Brother        psoriatic   Colon polyps Child    Colon cancer Neg Hx    Esophageal cancer Neg Hx    Stomach cancer Neg Hx    Rectal cancer Neg Hx     Social History   Socioeconomic History   Marital status: Divorced    Spouse name: Not on file   Number of children: 2   Years of education: Not on file   Highest education level: Not on file  Occupational History   Not on file  Tobacco Use   Smoking status: Never   Smokeless tobacco: Never  Substance and Sexual Activity   Alcohol use: Yes    Comment: socially   Drug use: Never   Sexual activity: Yes  Other Topics Concern   Not on file  Social History Narrative   Not on file   Social Determinants of Health   Financial Resource Strain: Not on file  Food Insecurity: Not on file  Transportation Needs: Not on file  Physical Activity: Not on file  Stress: Not on file  Social Connections: Not on file  Intimate Partner Violence: Not on file    Physical Exam: Vital signs in last 24 hours: @BP  (!) 146/81   Pulse 65   Temp 98.2 F (36.8 C)   Resp 12   Ht 5\' 3"  (1.6 m)   Wt 195 lb (88.5 kg)   SpO2 100%   BMI 34.54 kg/m  GEN: NAD EYE: Sclerae anicteric ENT: MMM CV: Non-tachycardic Pulm: CTA b/l GI: Soft, NT/ND NEURO:  Alert & Oriented x 3   Erick Blinks, MD [facility]  02/22/2023 9:23 AM

## 2023-02-26 ENCOUNTER — Telehealth: Payer: Self-pay | Admitting: *Deleted

## 2023-02-26 NOTE — Telephone Encounter (Signed)
Attempted f/u phone call. No answer. Left message. °

## 2023-02-27 ENCOUNTER — Encounter: Payer: Self-pay | Admitting: Internal Medicine

## 2023-11-19 ENCOUNTER — Other Ambulatory Visit: Payer: Self-pay | Admitting: Family

## 2023-11-19 DIAGNOSIS — Z0000 Encounter for general adult medical examination without abnormal findings: Secondary | ICD-10-CM

## 2023-12-13 ENCOUNTER — Ambulatory Visit

## 2023-12-27 ENCOUNTER — Ambulatory Visit
Admission: RE | Admit: 2023-12-27 | Discharge: 2023-12-27 | Disposition: A | Discharge status: Home / Self Care | Source: Ambulatory Visit | Attending: Family | Admitting: Family

## 2023-12-27 DIAGNOSIS — Z0000 Encounter for general adult medical examination without abnormal findings: Secondary | ICD-10-CM
